# Patient Record
Sex: Male | Born: 1995 | Hispanic: Yes
Health system: Southern US, Community
[De-identification: ages and names within clinical notes are randomized; demographics above are authoritative.]

---

## 2021-04-27 ENCOUNTER — Emergency Department
Admission: EM | Admit: 2021-04-27 | Discharge: 2021-04-27 | Disposition: A | Discharge status: Home / Self Care | Payer: Self-pay | Attending: Emergency Medicine | Admitting: Emergency Medicine

## 2021-04-27 ENCOUNTER — Emergency Department: Payer: Self-pay

## 2021-04-27 ENCOUNTER — Other Ambulatory Visit: Payer: Self-pay

## 2021-04-27 DIAGNOSIS — Y990 Civilian activity done for income or pay: Secondary | ICD-10-CM | POA: Insufficient documentation

## 2021-04-27 DIAGNOSIS — S52352A Displaced comminuted fracture of shaft of radius, left arm, initial encounter for closed fracture: Secondary | ICD-10-CM | POA: Insufficient documentation

## 2021-04-27 DIAGNOSIS — W19XXXA Unspecified fall, initial encounter: Secondary | ICD-10-CM | POA: Insufficient documentation

## 2021-04-27 MED ORDER — OXYCODONE-ACETAMINOPHEN 5-325 MG PO TABS
1.0000 | ORAL_TABLET | Freq: Once | ORAL | Status: AC
  Administered 2021-04-27: 1 via ORAL
  Filled 2021-04-27: qty 1

## 2021-04-27 MED ORDER — OXYCODONE-ACETAMINOPHEN 5-325 MG PO TABS: 1.0000 | ORAL_TABLET | Freq: Four times a day (QID) | ORAL | 0 refills | Status: AC | PRN

## 2021-04-27 NOTE — ED Triage Notes (Signed)
Pt comes with c/o left arm swelling. Pt states he doesn't know if he has a fracture or what is going on.  Pt does have noticeable swelling to arm and elbow area.  Pt states he injured at work and fell.

## 2021-04-27 NOTE — ED Provider Notes (Signed)
[facility] Emergency Department Provider Note  ____________________________________________  Time seen: Approximately 3:26 PM  I have reviewed the triage vital signs and the nursing notes.   HISTORY  Chief Complaint Arm Injury  Interpreter was used  HPI Jose Smith is a 25 y.o. male presents emergency department after mechanical fall complaining of left arm pain.  Patient states that he was at work, his feet got caught in some wires and he fell onto outstretched arms.  Patient is having pain and swelling to the left forearm.  Pain radiates from his elbow to his wrist.  No other injury sustained.  Patient denies any headache or visual changes.  No loss of sensation.  No medications for this complaint prior to arrival.         History reviewed. No pertinent past medical history.  There are no problems to display for this patient.   History reviewed. No pertinent surgical history.  Prior to Admission medications   Not on File    Allergies Patient has no allergy information on record.  No family history on file.  Social History     Review of Systems  Constitutional: No fever/chills Eyes: No visual changes. No discharge ENT: No upper respiratory complaints. Cardiovascular: no chest pain. Respiratory: no cough. No SOB. Gastrointestinal: No abdominal pain.  No nausea, no vomiting.  No diarrhea.  No constipation. Musculoskeletal: Left arm injury Skin: Negative for rash, abrasions, lacerations, ecchymosis. Neurological: Negative for headaches, focal weakness or numbness.  10 System ROS otherwise negative.  ____________________________________________   PHYSICAL EXAM:  VITAL SIGNS: ED Triage Vitals  Enc Vitals Group     BP 04/27/21 1439 109/83     Pulse Rate 04/27/21 1439 77     Resp 04/27/21 1439 18     Temp 04/27/21 1439 (!) 97.5 F (36.4 C)     Temp src --      SpO2 04/27/21 1439 100 %     Weight --      Height --      Head  Circumference --      Peak Flow --      Pain Score 04/27/21 1437 10     Pain Loc --      Pain Edu? --      Excl. in GC? --      Constitutional: Alert and oriented. Well appearing and in no acute distress. Eyes: Conjunctivae are normal. PERRL. EOMI. Head: Atraumatic. ENT:      Ears:       Nose: No congestion/rhinnorhea.      Mouth/Throat: Mucous membranes are moist.  Neck: No stridor.    Cardiovascular: Normal rate, regular rhythm. Normal S1 and S2.  Good peripheral circulation. Respiratory: Normal respiratory effort without tachypnea or retractions. Lungs CTAB. Good air entry to the bases with no decreased or absent breath sounds. Musculoskeletal: Full range of motion to all extremities. No gross deformities appreciated.  Visualization of the left forearm reveals some edema.  Patient is very tender to palpation starting over the radial head through the wrist.  No palpable abnormalities or deficits.  Radial pulses intact.  Sensation intact all digits. Neurologic:  Normal speech and language. No gross focal neurologic deficits are appreciated.  Skin:  Skin is warm, dry and intact. No rash noted. Psychiatric: Mood and affect are normal. Speech and behavior are normal. Patient exhibits appropriate insight and judgement.   ____________________________________________   LABS (all labs ordered are listed, but only abnormal results are displayed)  Labs Reviewed - No data to display ____________________________________________  EKG   ____________________________________________  RADIOLOGY I personally viewed and evaluated these images as part of my medical decision making, as well as reviewing the written report by the radiologist.  ED Provider Interpretation: Findings consistent with acute comminuted fracture with a 2 cm fracture fragment that is displaced.  DG Forearm Left  Result Date: 04/27/2021 CLINICAL DATA:  Status post fall.  Pain. EXAM: LEFT FOREARM - 2 VIEW COMPARISON:   None. FINDINGS: Transverse comminuted fracture of the proximal radial diaphysis with 6 mm of ulnar displacement of a 2 cm fracture fragment and 3 mm of dorsal displacement. Alignment at the fracture site is otherwise anatomic. No aggressive osseous lesion. Normal alignment. Soft tissue are unremarkable. No radiopaque foreign body or soft tissue emphysema. IMPRESSION: Transverse comminuted fracture of the proximal radial diaphysis with 6 mm of ulnar displacement of a 2 cm fracture fragment and 3 mm of dorsal displacement. Alignment at the fracture site is otherwise anatomic. Electronically Signed   By: Elige Ko   On: 04/27/2021 15:21    ____________________________________________    PROCEDURES  Procedure(s) performed:    .Splint Application  Date/Time: 04/27/2021 4:23 PM Performed by: Racheal Patches, PA-C Authorized by: Racheal Patches, PA-C   Consent:    Consent obtained:  Verbal   Consent given by:  Patient   Risks discussed:  Pain and swelling Universal protocol:    Procedure explained and questions answered to patient or proxy's satisfaction: yes     Patient identity confirmed:  Verbally with patient Pre-procedure details:    Distal neurologic exam:  Normal   Distal perfusion: distal pulses strong   Procedure details:    Location:  Arm   Arm location:  L lower arm   Splint type:  Sugar tong   Supplies:  Sling Post-procedure details:    Distal neurologic exam:  Normal   Distal perfusion: distal pulses strong     Procedure completion:  Tolerated well, no immediate complications   Post-procedure imaging: not applicable        Medications  oxyCODONE-acetaminophen (PERCOCET/ROXICET) 5-325 MG per tablet 1 tablet (1 tablet Oral Given 04/27/21 1544)     ____________________________________________   INITIAL IMPRESSION / ASSESSMENT AND PLAN / ED COURSE  Pertinent labs & imaging results that were available during my care of the patient were reviewed by me  and considered in my medical decision making (see chart for details).  Review of the [place] CSRS was performed in accordance of the [facility] prior to dispensing any controlled drugs.           Patient's diagnosis is consistent with radial fracture.  Patient presented to the emergency department after sustaining a fall.  Initially it appeared that the patient had sustained this injury today, however on further clarification it appears that this injury occurred yesterday.  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  This caused him to fall onto outstretched arm.  Patient is sustained a fracture to the shaft of the radius with fracture fragment that has displaced.  This time patient's arm is splinted with a sugar-tong splint.  He will be referred to orthopedics for further management of this fracture.  Patient will have a prescription for pain medication.  Return precautions to the emergency department are discussed with the patient.  Otherwise follow-up with orthopedics.  Patient is given ED precautions to return to  the ED for any worsening or new symptoms.     ____________________________________________  FINAL CLINICAL IMPRESSION(S) / ED DIAGNOSES  Final diagnoses:  Closed displaced comminuted fracture of shaft of left radius, initial encounter      NEW MEDICATIONS STARTED DURING THIS VISIT:  ED Discharge Orders    None          This chart was dictated using voice recognition software/Dragon. Despite best efforts to proofread, errors can occur which can change the meaning. Any change was purely unintentional.    Racheal Patches, PA-C 04/27/21 1624    Chesley Noon, MD 04/30/21 [phone]

## 2021-04-27 NOTE — ED Notes (Signed)
See triage note  Presents s/p fall yesterday   Landed on left arm  Pain and swelling noted to left forearm and elbow

## 2022-08-09 IMAGING — CR DG FOREARM 2V*L*
1 series · 2 of 2 positions shown · non-contrast
Comparison: None.

CLINICAL DATA: Status post fall.  Pain.

EXAM:
LEFT FOREARM - 2 VIEW

[Series 1: dg forearm right · 0.14mm/px · 2 of 2 slices shown]
[im 1/2]
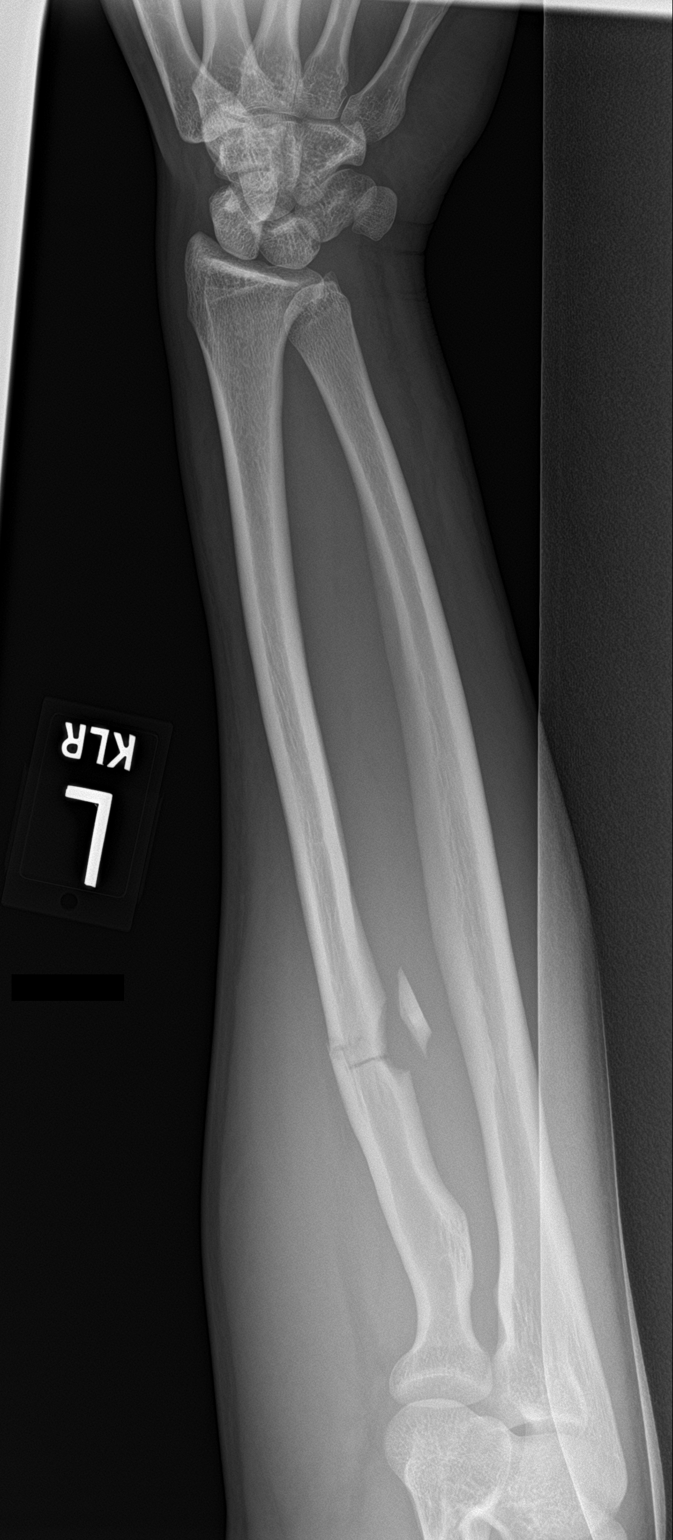
[im 2/2]
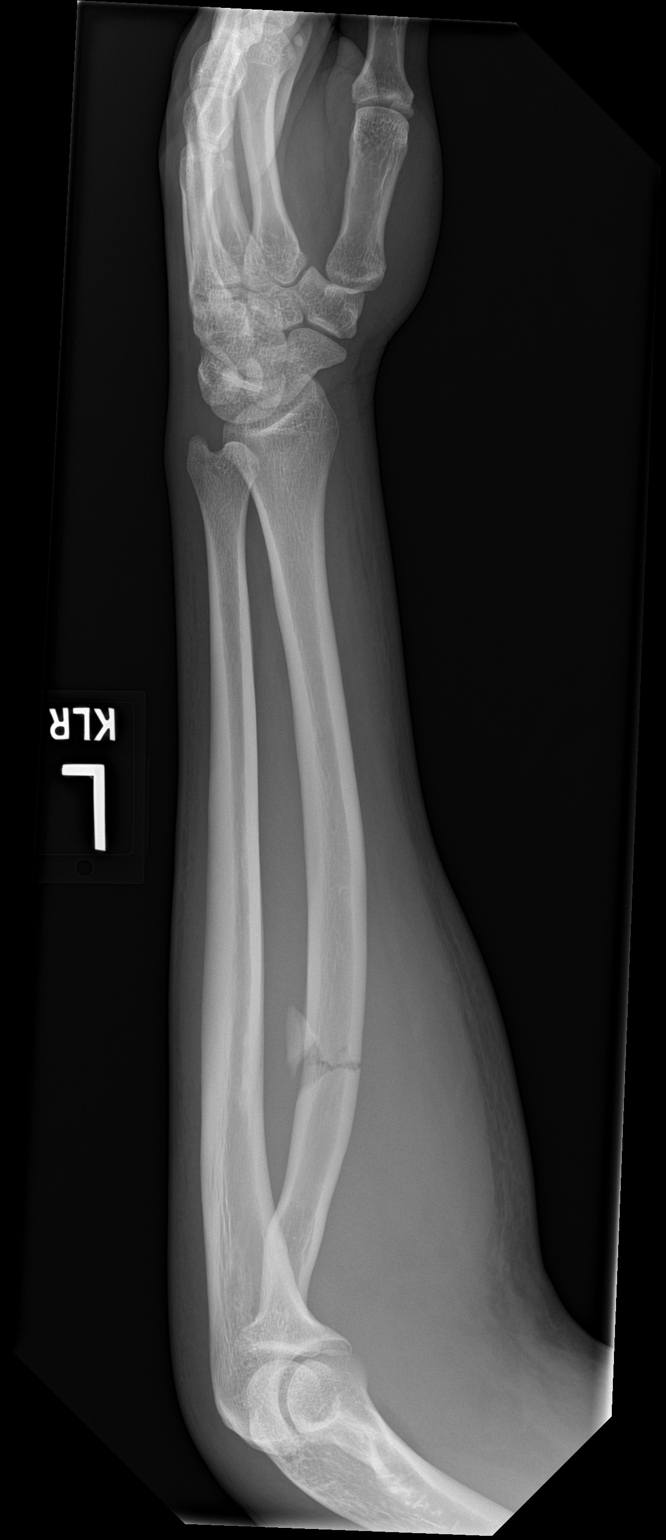

[2 of 2 positions shown; findings below may reference images not displayed]

FINDINGS: Transverse comminuted fracture of the proximal radial diaphysis with
6 mm of ulnar displacement of a 2 cm fracture fragment and 3 mm of
dorsal displacement. Alignment at the fracture site is otherwise
anatomic. No aggressive osseous lesion. Normal alignment.

Soft tissue are unremarkable. No radiopaque foreign body or soft
tissue emphysema.
IMPRESSION: Transverse comminuted fracture of the proximal radial diaphysis with
6 mm of ulnar displacement of a 2 cm fracture fragment and 3 mm of
dorsal displacement. Alignment at the fracture site is otherwise
anatomic.
